# Patient Record
Sex: Female | Born: 1965 | Hispanic: Yes | Marital: Married | Smoking: Former smoker
Health system: Southern US, Community
[De-identification: ages and names within clinical notes are randomized; demographics above are authoritative.]

## PROBLEM LIST (undated history)

## (undated) DIAGNOSIS — F411 Generalized anxiety disorder: Secondary | ICD-10-CM

---

## 2010-05-06 ENCOUNTER — Ambulatory Visit: Payer: Self-pay | Admitting: Family Medicine

## 2011-09-22 ENCOUNTER — Ambulatory Visit: Payer: Self-pay | Admitting: Family Medicine

## 2011-12-25 HISTORY — PX: BREAST CYST ASPIRATION: SHX578

## 2012-10-04 ENCOUNTER — Ambulatory Visit: Payer: Self-pay

## 2015-02-20 ENCOUNTER — Ambulatory Visit: Admit: 2015-02-20 | Disposition: A | Discharge status: Home / Self Care | Payer: Self-pay | Admitting: Family Medicine

## 2016-10-29 ENCOUNTER — Other Ambulatory Visit: Payer: Self-pay | Admitting: Family Medicine

## 2016-10-29 DIAGNOSIS — N6310 Unspecified lump in the right breast, unspecified quadrant: Secondary | ICD-10-CM

## 2016-11-18 ENCOUNTER — Ambulatory Visit
Admission: RE | Admit: 2016-11-18 | Discharge: 2016-11-18 | Disposition: A | Discharge status: Home / Self Care | Payer: BLUE CROSS/BLUE SHIELD | Source: Ambulatory Visit | Attending: Family Medicine | Admitting: Family Medicine

## 2016-11-18 ENCOUNTER — Encounter: Payer: Self-pay | Admitting: Radiology

## 2016-11-18 DIAGNOSIS — Z803 Family history of malignant neoplasm of breast: Secondary | ICD-10-CM | POA: Diagnosis not present

## 2016-11-18 DIAGNOSIS — N6310 Unspecified lump in the right breast, unspecified quadrant: Secondary | ICD-10-CM

## 2016-11-18 DIAGNOSIS — N6001 Solitary cyst of right breast: Secondary | ICD-10-CM | POA: Insufficient documentation

## 2016-11-18 DIAGNOSIS — N6311 Unspecified lump in the right breast, upper outer quadrant: Secondary | ICD-10-CM | POA: Diagnosis present

## 2016-11-24 ENCOUNTER — Other Ambulatory Visit: Payer: Self-pay | Admitting: Family Medicine

## 2016-11-24 DIAGNOSIS — R928 Other abnormal and inconclusive findings on diagnostic imaging of breast: Secondary | ICD-10-CM

## 2016-11-24 DIAGNOSIS — N6001 Solitary cyst of right breast: Secondary | ICD-10-CM

## 2016-12-02 ENCOUNTER — Ambulatory Visit
Admission: RE | Admit: 2016-12-02 | Discharge: 2016-12-02 | Disposition: A | Discharge status: Home / Self Care | Payer: BLUE CROSS/BLUE SHIELD | Source: Ambulatory Visit | Attending: Family Medicine | Admitting: Family Medicine

## 2016-12-02 DIAGNOSIS — N6001 Solitary cyst of right breast: Secondary | ICD-10-CM

## 2016-12-02 DIAGNOSIS — R928 Other abnormal and inconclusive findings on diagnostic imaging of breast: Secondary | ICD-10-CM

## 2017-03-22 IMAGING — US US BREAST*R* LIMITED INC AXILLA
1 series · 10 of 10 positions shown · non-contrast
Comparison: Previous exam(s).

CLINICAL DATA: 50-year-old female presenting for evaluation of
palpable lumps in the right breast identified initially in
[REDACTED].The patient does endorse discomfort related to the masses.

EXAM:
2D DIGITAL DIAGNOSTIC BILATERAL MAMMOGRAM WITH CAD AND ADJUNCT TOMO
RIGHT BREAST ULTRASOUND

[Series 1: us breast*right* limited inc axilla · 0.06mm/px · 10 of 10 slices shown]
[im 1/10]
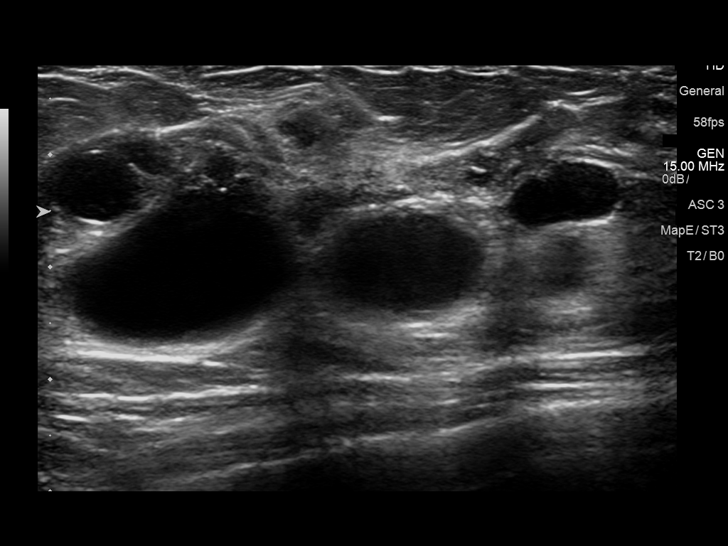
[im 2/10]
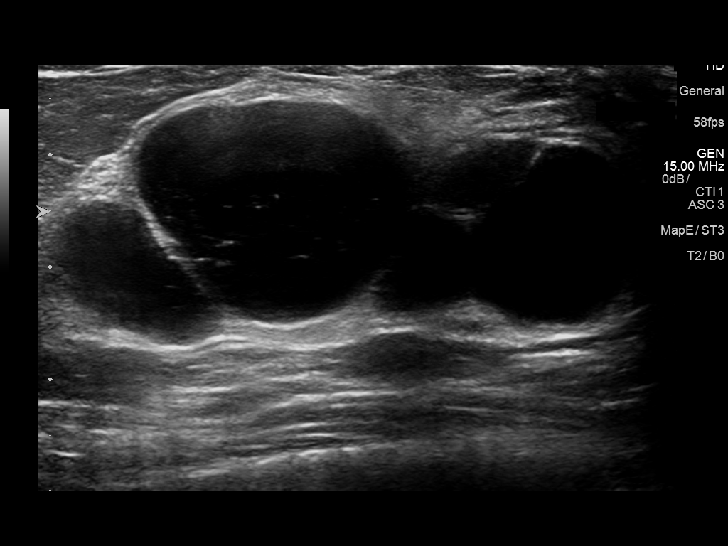
[im 3/10]
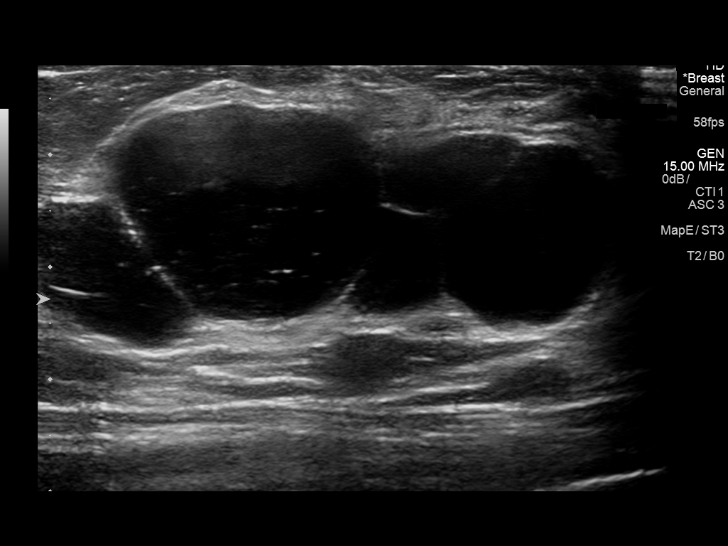
[im 4/10]
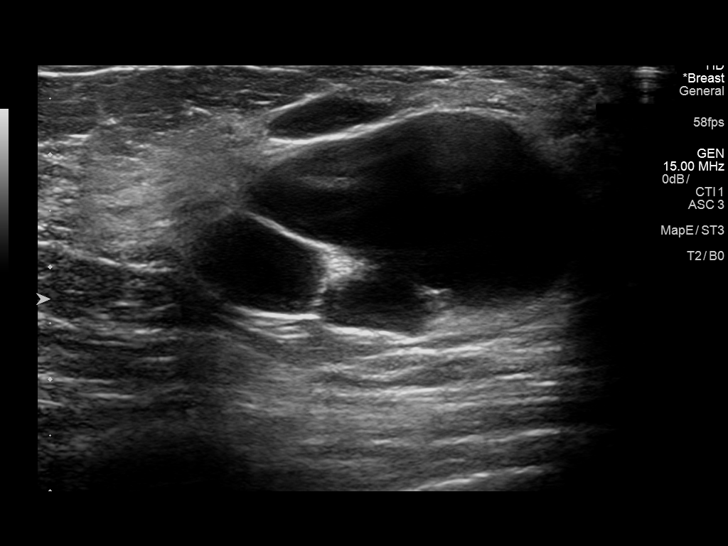
[im 5/10]
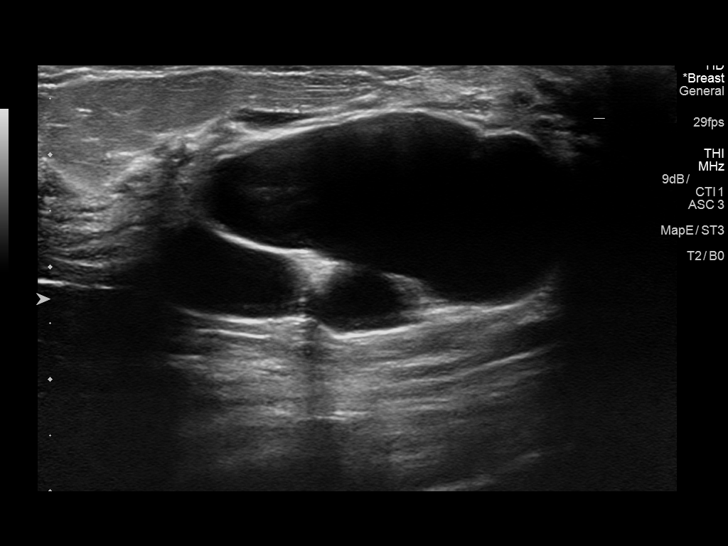
[im 6/10]
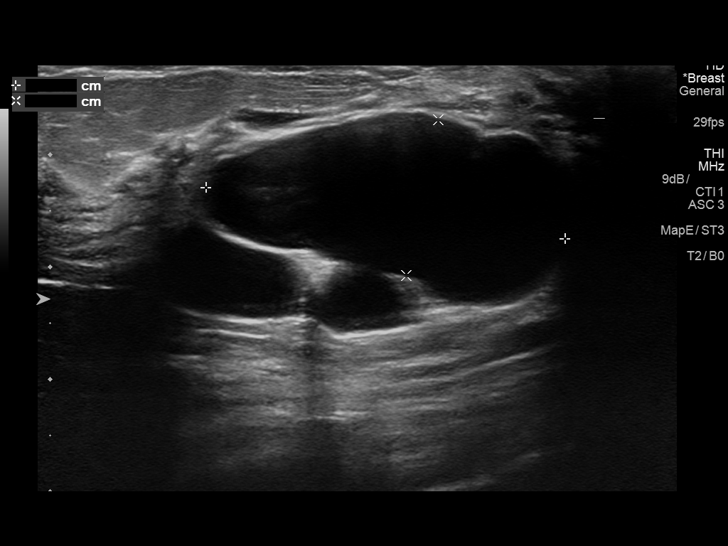
[im 7/10]
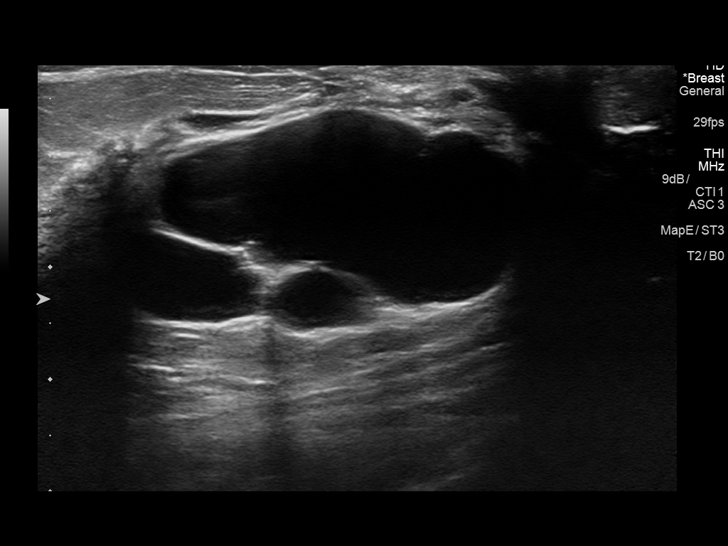
[im 8/10]
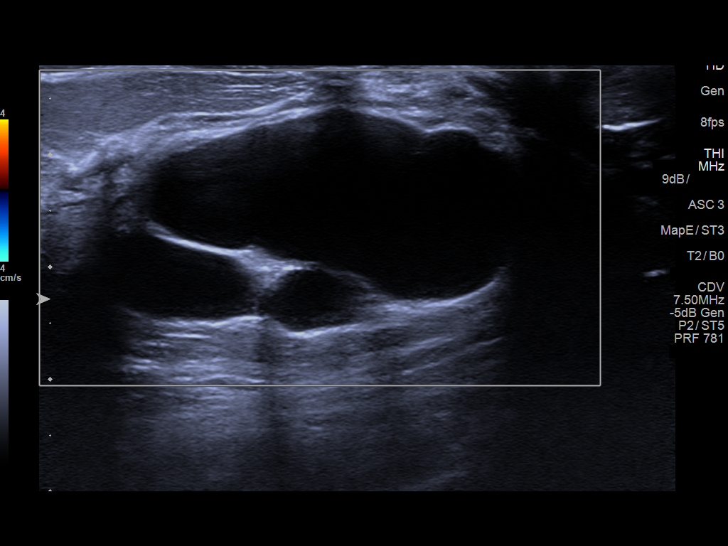
[im 9/10]
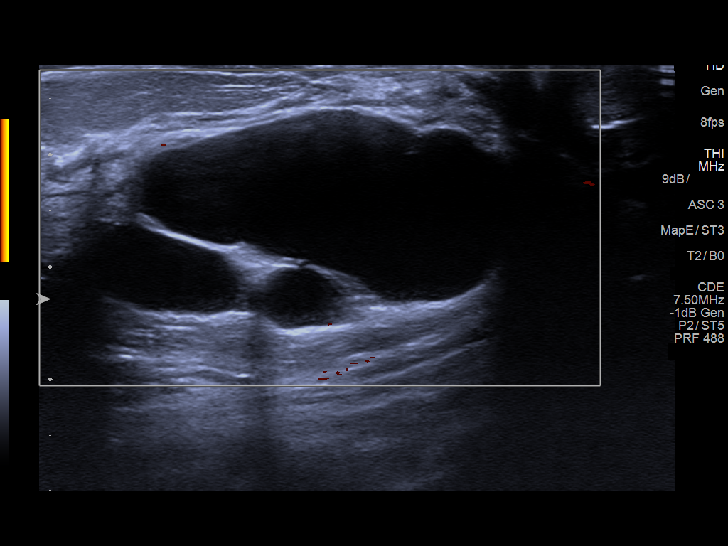
[im 10/10]
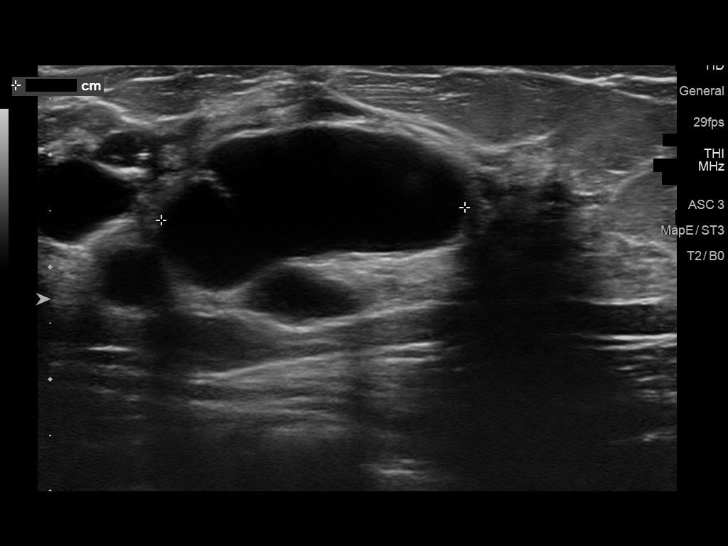

[10 of 10 positions shown; findings below may reference images not displayed]

ACR Breast Density Category c: The breast tissue is heterogeneously
dense, which may obscure small masses.
FINDINGS: Two palpable markers have been placed in the upper-outer quadrant of
the right breast. Deep to these markers there are multiple large
round and oval circumscribed masses, with similar-appearing masses
in the contralateral breast. No suspicious calcifications, masses or
areas of distortion are seen in the bilateral breasts.

Mammographic images were processed with CAD.

Multiple palpable masses are present in the upper outer quadrant of
the right breast.

In the upper-outer quadrant of the right breast from [DATE] to [DATE]
there are numerous anechoic confluent circumscribed masses
compatible with benign cysts. No suspicious masses or areas of
shadowing are identified.
IMPRESSION: 1. Numerous benign cysts are identified at the palpable area concern
in the upper-outer right breast.

2.  No mammographic evidence of malignancy in the bilateral breasts.

RECOMMENDATION:
1. The patient desires ultrasound-guided aspiration for symptomatic
relief of the benign cysts. We can aspirate several largest
confluent cysts to relieve pressure.

2.  Screening mammogram in one year.(Code:[id number])

3. Given the early onset of breast cancer in the patient's mother
and her great grandmother (Both in their 30's), the patient has and
at least 22.6% lifetime risk of breast cancer according to the
Tyrer-Cuzick model. Per [employer] Society guidelines, if the
patient has a calculated lifetime risk of developing breast cancer
of greater than 20%, annual screening MRI of the breasts would be
recommended at the time of screening mammography.

I have discussed the findings and recommendations with the patient.
Results were also provided in writing at the conclusion of the
visit. If applicable, a reminder letter will be sent to the patient
regarding the next appointment.

BI-RADS CATEGORY  2: Benign.

## 2019-06-29 ENCOUNTER — Ambulatory Visit (INDEPENDENT_AMBULATORY_CARE_PROVIDER_SITE_OTHER): Payer: BLUE CROSS/BLUE SHIELD

## 2019-06-29 ENCOUNTER — Other Ambulatory Visit: Payer: Self-pay

## 2019-06-29 ENCOUNTER — Ambulatory Visit
Admission: EM | Admit: 2019-06-29 | Discharge: 2019-06-29 | Disposition: A | Discharge status: Home / Self Care | Payer: BLUE CROSS/BLUE SHIELD

## 2019-06-29 DIAGNOSIS — M25431 Effusion, right wrist: Secondary | ICD-10-CM

## 2019-06-29 DIAGNOSIS — M25531 Pain in right wrist: Secondary | ICD-10-CM

## 2019-06-29 HISTORY — DX: Generalized anxiety disorder: F41.1

## 2019-06-29 MED ORDER — PREDNISONE 10 MG (21) PO TBPK: ORAL_TABLET | ORAL | 1 refills | Status: AC

## 2019-06-29 NOTE — ED Triage Notes (Signed)
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 Is swollen and unable to do activities of daily living. Did purchase wrist brace and otc meds without relief.

## 2019-06-29 NOTE — ED Provider Notes (Signed)
[facility]    CSN: [id number] Arrival date & time: 06/29/19  1302     History   Chief Complaint Chief Complaint  Patient presents with  . Wrist Pain    HPI Kelli Zhang is a 53 y.o. female.   53 year old female present with right wrist pain that started about 3 weeks ago. 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 Has decreased lifting but symptoms have progressively gotten worse with decreased range of motion, swelling and occasional numbness of hand and fingers. Has been taking Ibuprofen and Tylenol with no relief. Has also tried wearing a wrist brace with no relief. No previous injury to right wrist/hand. Other chronic health issues include anxiety/sleep disorder and takes Trazodone prn.   The history is provided by the patient.    Past Medical History:  Diagnosis Date  . Generalized anxiety disorder     There are no active problems to display for this patient.   Past Surgical History:  Procedure Laterality Date  . BREAST CYST ASPIRATION Left 12/2011   FNA cyst    OB History   No obstetric history on file.      Home Medications    Prior to Admission medications   Medication Sig Start Date End Date Taking? Authorizing Provider  traZODone (DESYREL) 50 MG tablet Take by mouth.   Yes [provider]  predniSONE (STERAPRED UNI-PAK 21 TAB) 10 MG (21) TBPK tablet Take 6 tabs by mouth on day 1 then decrease by 1 tablet each day until finished. 06/29/19   Katy Apo, NP    Family History Family History  Problem Relation Age of Onset  . Breast cancer Mother 73       Also a great MGM 64's  . Diabetes Father   . Heart failure Father   . Hypertension Father     Social History Social History   Tobacco Use  . Smoking status: Former Research scientist (life sciences)  . Smokeless tobacco: Never Used  Substance Use Topics  . Alcohol use: Never    Frequency: Never  .  Drug use: Never     Allergies   Iodinated diagnostic agents, Latex, and Soy allergy   Review of Systems Review of Systems  Constitutional: Negative for activity change, appetite change, chills, fatigue and fever.  Respiratory: Negative for chest tightness, shortness of breath and wheezing.   Gastrointestinal: Negative for nausea and vomiting.  Musculoskeletal: Positive for arthralgias, joint swelling and myalgias.  Skin: Positive for color change. Negative for rash and wound.  Allergic/Immunologic: Positive for environmental allergies and food allergies.  Neurological: Positive for numbness. Negative for dizziness, tremors, seizures, syncope, speech difficulty, weakness, light-headedness and headaches.  Hematological: Negative for adenopathy. Does not bruise/bleed easily.  Psychiatric/Behavioral: Positive for sleep disturbance.     Physical Exam Triage Vital Signs ED Triage Vitals [06/29/19 1321]  Enc Vitals Group     BP (!) 152/92     Pulse Rate 74     Resp 18     Temp 98.5 F (36.9 C)     Temp Source Oral     SpO2 100 %     Weight 143 lb (64.9 kg)     Height      Head Circumference      Peak Flow      Pain Score 7     Pain Loc      Pain  Edu?      Excl. in GC?    No data found.  Updated Vital Signs BP (!) 152/92 (BP Location: Right Arm)   Pulse 74   Temp 98.5 F (36.9 C) (Oral)   Resp 18   Wt 143 lb (64.9 kg)   SpO2 100%   Visual Acuity Right Eye Distance:   Left Eye Distance:   Bilateral Distance:    Right Eye Near:   Left Eye Near:    Bilateral Near:     Physical Exam Vitals signs and nursing note reviewed.  Constitutional:      General: She is awake. She is not in acute distress.    Appearance: She is well-developed, well-groomed and normal weight. She is not ill-appearing.     Comments: Patient sitting comfortably on exam table in no acute distress but holding her wrist outstretched and appears in pain.  Blood pressure slightly elevated  probably due to pain.   HENT:     Head: Normocephalic and atraumatic.  Eyes:     Extraocular Movements: Extraocular movements intact.     Conjunctiva/sclera: Conjunctivae normal.  Cardiovascular:     Rate and Rhythm: Normal rate.  Pulmonary:     Effort: Pulmonary effort is normal.  Musculoskeletal:        General: Swelling and tenderness present.     Right hand: She exhibits decreased range of motion, tenderness, disruption of two-point discrimination and swelling. She exhibits normal capillary refill and no deformity. Normal sensation noted. Normal strength noted.       Hands:     Comments: Decreased range of motion of right wrist, especially with rotation and flexion. Very tender along right distal radius with swelling and slight redness present (uncertain if redness is from massaging/rubbing area). Good distal pulses and capillary refill. No neuro deficits present.   Skin:    General: Skin is warm and dry.     Capillary Refill: Capillary refill takes less than 2 seconds.     Findings: No rash.  Neurological:     General: No focal deficit present.     Mental Status: She is alert and oriented to person, place, and time.     Sensory: Sensation is intact.     Motor: Motor function is intact.  Psychiatric:        Attention and Perception: Attention normal.        Mood and Affect: Mood normal.        Speech: Speech normal.        Behavior: Behavior normal. Behavior is cooperative.        Thought Content: Thought content normal.        Judgment: Judgment normal.      UC Treatments / Results  Labs (all labs ordered are listed, but only abnormal results are displayed) Labs Reviewed - No data to display  EKG   Radiology Dg Wrist Complete Right  Result Date: 06/29/2019 CLINICAL DATA:  RIGHT wrist pain for 3 weeks, getting worse in past week, some numbness of pain over the distal radius, no known injury EXAM: RIGHT WRIST - COMPLETE 3+ VIEW COMPARISON:  None FINDINGS: Osseous  mineralization normal for technique. Small spur at tip of radial styloid process. Ununited ossification center at carpometacarpal joints on lateral view, appears corticated and old. Joint spaces preserved. No acute fracture, dislocation, or bone destruction. IMPRESSION: No acute osseous abnormalities. Electronically Signed   By: Ulyses SouthwardMark  Boles M.D.   On: 06/29/2019 14:03    Procedures Procedures (  including critical care time)  Medications Ordered in UC Medications - No data to display  Initial Impression / Assessment and Plan / UC Course  I have reviewed the triage vital signs and the nursing notes.  Pertinent labs & imaging results that were available during my care of the patient were reviewed by me and considered in my medical decision making (see chart for details).    Reviewed x-ray results with patient- no acute injury. Discussed radial bone spur but does not appear to correspond with level of tenderness/pain. Discussed that she may have a ligament or tendon strain. Recommend trial Prednisone 10mg  6 day dose pack as directed. Provided 1 refill in case she needs to increase Prednisone dose and taper longer after a few days. Wear ace wrap for support instead of brace for now. Call [facility] today to schedule appointment for further evaluation.  Final Clinical Impressions(s) / UC Diagnoses   Final diagnoses:  Acute pain of right wrist  Swelling of joint, wrist, right     Discharge Instructions     Recommend start Prednisone 10mg  tablets - take 6 tablets today then decrease by 1 tablet each day until finished. Recommend wear ace wrap for support. Call [facility] today to schedule appointment for further evaluation.     ED Prescriptions    Medication Sig Dispense Auth. Provider   predniSONE (STERAPRED UNI-PAK 21 TAB) 10 MG (21) TBPK tablet Take 6 tabs by mouth on day 1 then decrease by 1 tablet each day until finished. 21 tablet Sudie GrumblingAmyot, Deva Ron Berry, NP     Controlled Substance  Prescriptions [place] Controlled Substance Registry consulted? Not Applicable   Sudie Grumblingmyot, Sigfredo Schreier Berry, NP 06/29/19 2037

## 2019-06-29 NOTE — Discharge Instructions (Addendum)
Recommend start Prednisone 10mg  tablets - take 6 tablets today then decrease by 1 tablet each day until finished. Recommend wear ace wrap for support. Call [facility] today to schedule appointment for further evaluation.

## 2020-08-02 IMAGING — CR RIGHT WRIST - COMPLETE 3+ VIEW
4 series · 4 of 4 positions shown · non-contrast
Comparison: None

CLINICAL DATA: RIGHT wrist pain for 3 weeks, getting worse in past
week, some numbness of pain over the distal radius, no known injury

EXAM:
RIGHT WRIST - COMPLETE 3+ VIEW

[wrist pa]
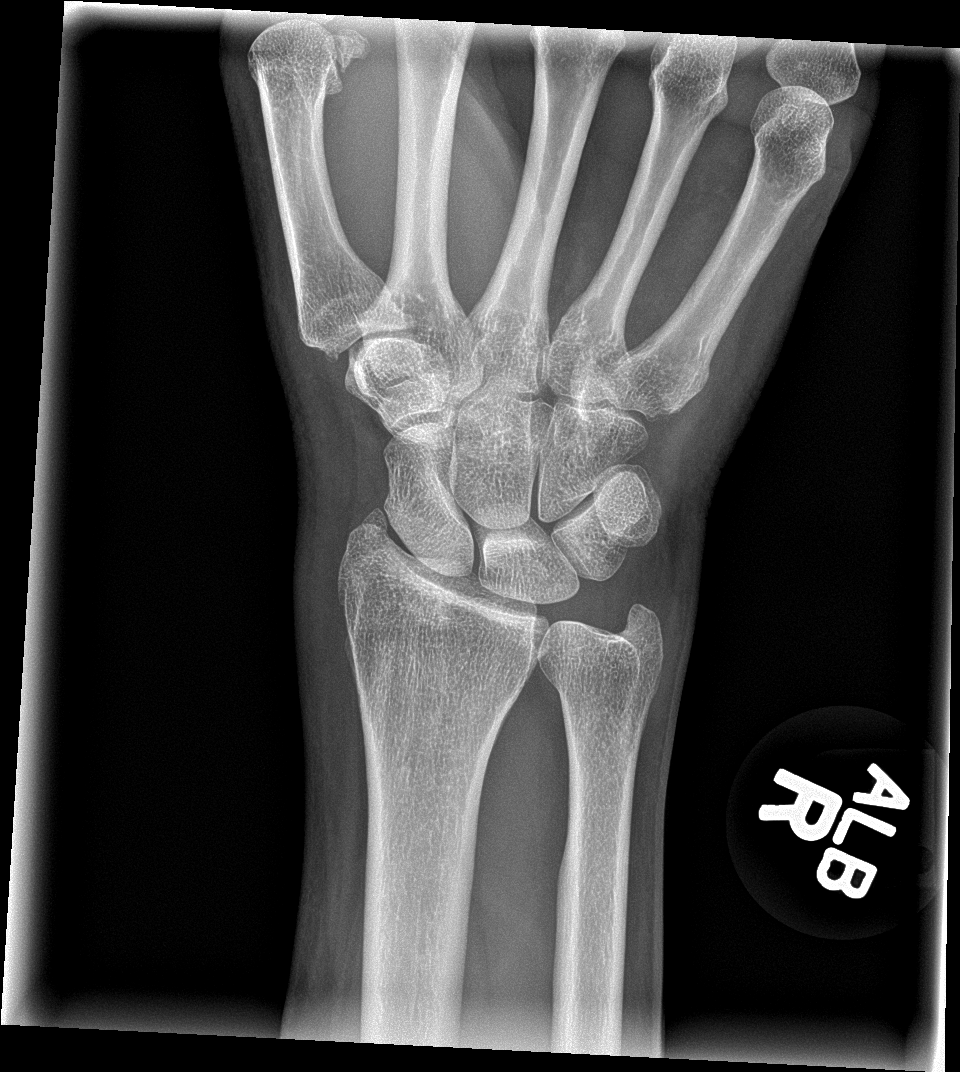

[wrist obl]
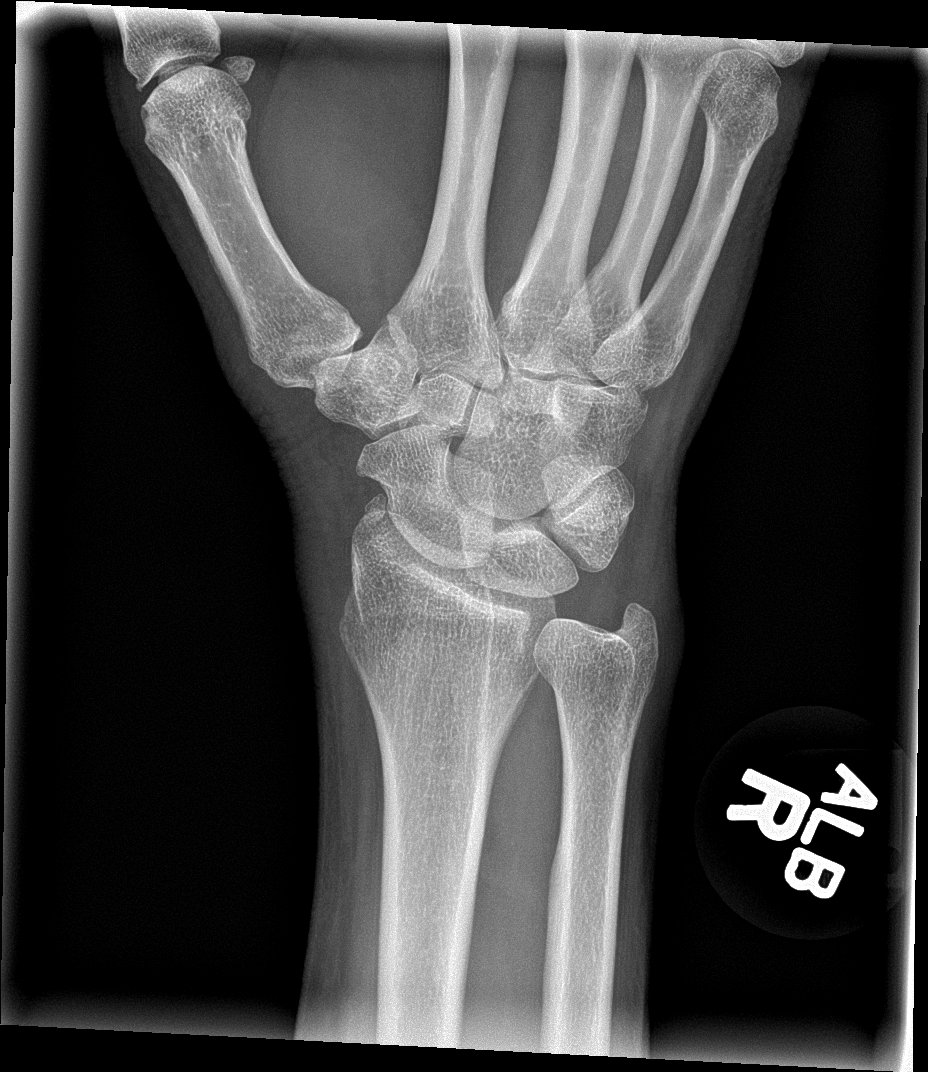

[wrist lat]
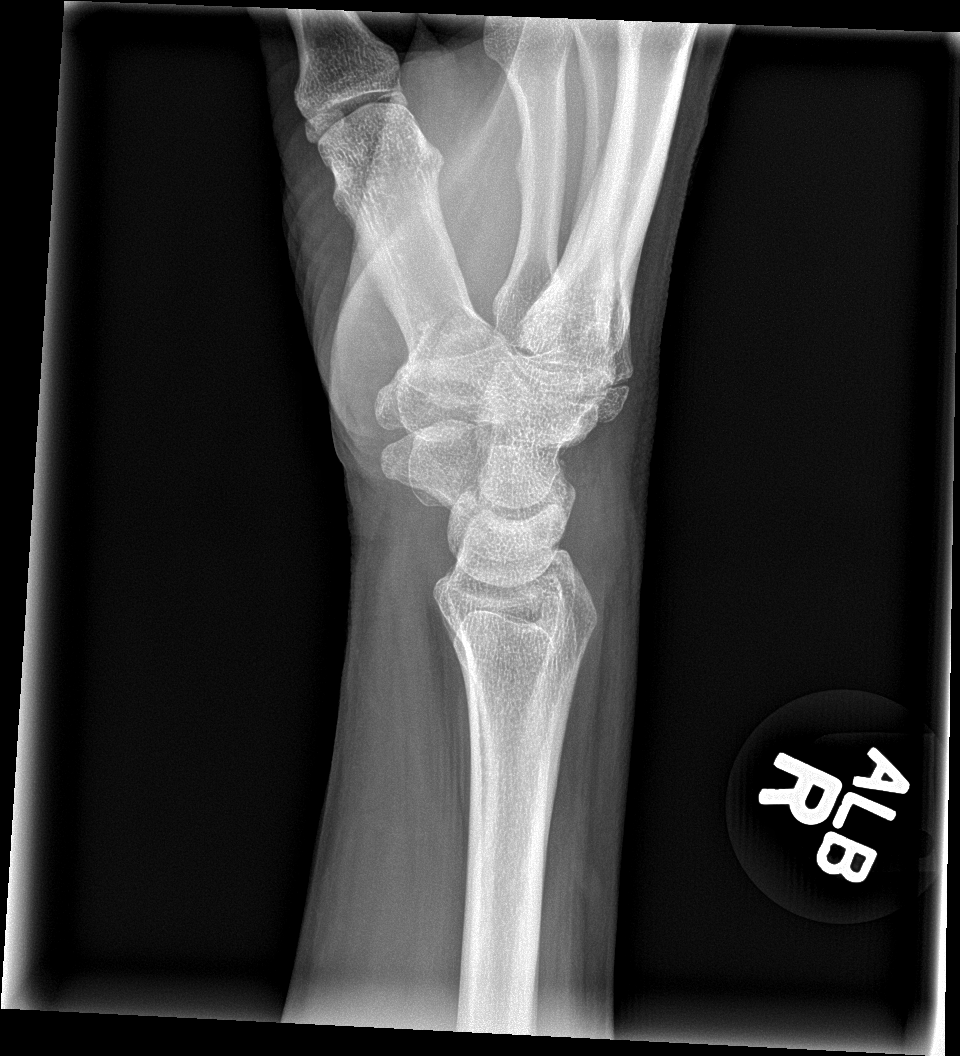

[wrist navicular]
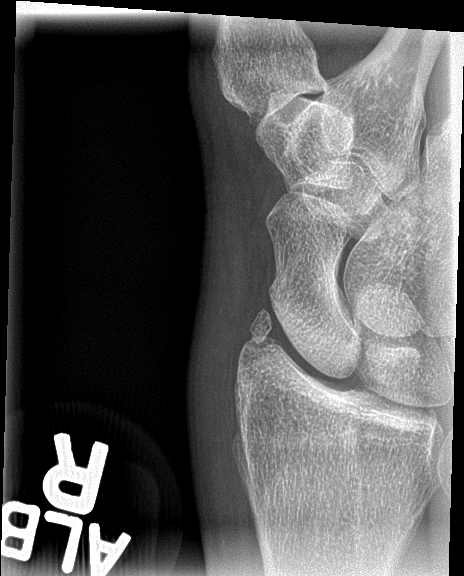

[4 of 4 positions shown; findings below may reference images not displayed]

FINDINGS: Osseous mineralization normal for technique.

Small spur at tip of radial styloid process.

Ununited ossification center at carpometacarpal joints on lateral
view, appears corticated and old.

Joint spaces preserved.

No acute fracture, dislocation, or bone destruction.
IMPRESSION: No acute osseous abnormalities.
